# Patient Record
Sex: Male | Born: 1995 | Race: White | Hispanic: No | Marital: Single | Smoking: Never smoker
Health system: Southern US, Community
[De-identification: ages and names within clinical notes are randomized; demographics above are authoritative.]

## PROBLEM LIST (undated history)

## (undated) DIAGNOSIS — K219 Gastro-esophageal reflux disease without esophagitis: Secondary | ICD-10-CM

## (undated) DIAGNOSIS — IMO0001 Reserved for inherently not codable concepts without codable children: Secondary | ICD-10-CM

---
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
## 2013-05-17 ENCOUNTER — Emergency Department (HOSPITAL_COMMUNITY)
Admission: EM | Admit: 2013-05-17 | Discharge: 2013-05-17 | Discharge status: Against Medical Advice | Payer: Self-pay | Attending: Emergency Medicine | Admitting: Emergency Medicine

## 2013-05-17 DIAGNOSIS — F10929 Alcohol use, unspecified with intoxication, unspecified: Secondary | ICD-10-CM

## 2013-05-17 DIAGNOSIS — G4730 Sleep apnea, unspecified: Secondary | ICD-10-CM | POA: Insufficient documentation

## 2013-05-17 DIAGNOSIS — F1010 Alcohol abuse, uncomplicated: Secondary | ICD-10-CM | POA: Insufficient documentation

## 2013-05-17 NOTE — ED Notes (Signed)
Bed: WA04 Expected date:  Expected time:  Means of arrival:  Comments: 

## 2013-05-17 NOTE — ED Provider Notes (Signed)
CSN: [id number]     Arrival date & time 05/17/13  0358 History   First MD Initiated Contact with Patient 05/17/13 [phone]     Chief Complaint  Patient presents with  . Alcohol Intoxication     (Consider location/radiation/quality/duration/timing/severity/associated sxs/prior Treatment) Patient is a 18 y.o. male presenting with intoxication. The history is provided by the patient. The history is limited by the condition of the patient (Altered mental status).  Alcohol Intoxication  He was brought to the ED with report of possible intoxication. He states that he has not remember anything that happened at night but denies drinking. He states that he just wants to leave the ED. He states that he does not trust hospitals and does not trust doctors and states that I might be trying to strangle him.  No past medical history on file. No past surgical history on file. No family history on file. History  Substance Use Topics  . Smoking status: Not on file  . Smokeless tobacco: Not on file  . Alcohol Use: Not on file    Review of Systems  Unable to perform ROS: Mental status change      Allergies  Review of patient's allergies indicates not on file.  Home Medications  No current outpatient prescriptions on file. BP 107/66  Pulse 60  Resp 19  SpO2 99% Physical Exam  Nursing note and vitals reviewed.  18 year old male, resting comfortably and in no acute distress. Vital signs are normal. Oxygen saturation is 99%, which is normal. Head is normocephalic and atraumatic. PERRLA, EOMI. Oropharynx is clear. Neck is nontender and supple without adenopathy or JVD. Back is nontender and there is no CVA tenderness. Lungs are clear without rales, wheezes, or rhonchi. Chest is nontender. Heart has regular rate and rhythm without murmur. Abdomen is soft, flat, nontender without masses or hepatosplenomegaly and peristalsis is normoactive. Extremities have no cyanosis or edema, full range of  motion is present. Skin is warm and dry without rash. Neurologic: He is awake but slow to answer questions. Speech is slightly slurred. He is oriented to person and place but is slightly disoriented to time) thinks it is 05/23/2013), cranial nerves are intact, there are no motor or sensory deficits.  ED Course  Procedures (including critical care time) Labs Review Results for orders placed during the hospital encounter of 05/17/13  CBC WITH DIFFERENTIAL      Result Value Ref Range   WBC 8.1  4.0 - 10.5 K/uL   RBC 4.72  4.22 - 5.81 MIL/uL   Hemoglobin 15.0  13.0 - 17.0 g/dL   HCT 11.943.9  14.739.0 - 82.952.0 %   MCV 93.0  78.0 - 100.0 fL   MCH 31.8  26.0 - 34.0 pg   MCHC 34.2  30.0 - 36.0 g/dL   RDW 56.212.1  13.011.5 - 86.515.5 %   Platelets 246  150 - 400 K/uL   Neutrophils Relative % 56  43 - 77 %   Neutro Abs 4.5  1.7 - 7.7 K/uL   Lymphocytes Relative 35  12 - 46 %   Lymphs Abs 2.8  0.7 - 4.0 K/uL   Monocytes Relative 6  3 - 12 %   Monocytes Absolute 0.5  0.1 - 1.0 K/uL   Eosinophils Relative 3  0 - 5 %   Eosinophils Absolute 0.2  0.0 - 0.7 K/uL   Basophils Relative 0  0 - 1 %   Basophils Absolute 0.0  0.0 - 0.1 K/uL  COMPREHENSIVE METABOLIC PANEL      Result Value Ref Range   Sodium 144  137 - 147 mEq/L   Potassium 4.0  3.7 - 5.3 mEq/L   Chloride 103  96 - 112 mEq/L   CO2 27  19 - 32 mEq/L   Glucose, Bld 90  70 - 99 mg/dL   BUN 10  6 - 23 mg/dL   Creatinine, Ser 1.611.17  0.50 - 1.35 mg/dL   Calcium 8.8  8.4 - 09.610.5 mg/dL   Total Protein 6.8  6.0 - 8.3 g/dL   Albumin 3.8  3.5 - 5.2 g/dL   AST 17  0 - 37 U/L   ALT 12  0 - 53 U/L   Alkaline Phosphatase 67  39 - 117 U/L   Total Bilirubin 0.3  0.3 - 1.2 mg/dL   GFR calc non Af Amer 89 (*) >90 mL/min   GFR calc Af Amer >90  >90 mL/min  ETHANOL      Result Value Ref Range   Alcohol, Ethyl (B) 244 (*) 0 - 11 mg/dL  URINE RAPID DRUG SCREEN (HOSP PERFORMED)      Result Value Ref Range   Opiates NONE DETECTED  NONE DETECTED   Cocaine NONE DETECTED   NONE DETECTED   Benzodiazepines NONE DETECTED  NONE DETECTED   Amphetamines NONE DETECTED  NONE DETECTED   Tetrahydrocannabinol POSITIVE (*) NONE DETECTED   Barbiturates NONE DETECTED  NONE DETECTED   MDM   Final diagnoses:  Alcohol intoxication    Altered mental status which is likely due 2 intoxication with alcohol or other drugs. Alcohol level and are extremely been obtained.  Workup is significant for elevated alcohol level of 244 which would explain his altered mentation. Drug screen is positive for tetrahydrocannabinol, but this is not clinically relevant. Patient is reevaluated and is still not thinking clearly enough to be allowed to be discharged without observation. He is advised that if someone can come and get him, he can be discharged. Otherwise, he will need to be observed in the ED until he is able to manage safely for himself.  Dione Boozeavid Alfrieda Tarry, MD 05/17/13 [phone]  Dione Boozeavid Jacobie Stamey, MD 05/17/13 [phone]

## 2013-05-17 NOTE — Discharge Instructions (Signed)
Alcohol Intoxication °Alcohol intoxication occurs when the amount of alcohol that a person has consumed impairs his or her ability to mentally and physically function. Alcohol directly impairs the normal chemical activity of the brain. Drinking large amounts of alcohol can lead to changes in mental function and behavior, and it can cause many physical effects that can be harmful.  °Alcohol intoxication can range in severity from mild to very severe. Various factors can affect the level of intoxication that occurs, such as the person's age, gender, weight, frequency of alcohol consumption, and the presence of other medical conditions (such as diabetes, seizures, or heart conditions). Dangerous levels of alcohol intoxication may occur when people drink large amounts of alcohol in a short period (binge drinking). Alcohol can also be especially dangerous when combined with certain prescription medicines or "recreational" drugs. °SIGNS AND SYMPTOMS °Some common signs and symptoms of mild alcohol intoxication include: °· Loss of coordination. °· Changes in mood and behavior. °· Impaired judgment. °· Slurred speech. °As alcohol intoxication progresses to more severe levels, other signs and symptoms will appear. These may include: °· Vomiting. °· Confusion and impaired memory. °· Slowed breathing. °· Seizures. °· Loss of consciousness. °DIAGNOSIS  °Your health care provider will take a medical history and perform a physical exam. You will be asked about the amount and type of alcohol you have consumed. Blood tests will be done to measure the concentration of alcohol in your blood. In many places, your blood alcohol level must be lower than 80 mg/dL (0.08%) to legally drive. However, many dangerous effects of alcohol can occur at much lower levels.  °TREATMENT  °People with alcohol intoxication often do not require treatment. Most of the effects of alcohol intoxication are temporary, and they go away as the alcohol naturally  leaves the body. Your health care provider will monitor your condition until you are stable enough to go home. Fluids are sometimes given through an IV access tube to help prevent dehydration.  °HOME CARE INSTRUCTIONS °· Do not drive after drinking alcohol. °· Stay hydrated. Drink enough water and fluids to keep your urine clear or pale yellow. Avoid caffeine.   °· Only take over-the-counter or prescription medicines as directed by your health care provider.   °SEEK MEDICAL CARE IF:  °· You have persistent vomiting.   °· You do not feel better after a few days. °· You have frequent alcohol intoxication. Your health care provider can help determine if you should see a substance use treatment counselor. °SEEK IMMEDIATE MEDICAL CARE IF:  °· You become shaky or tremble when you try to stop drinking.   °· You shake uncontrollably (seizure).   °· You throw up (vomit) blood. This may be bright red or may look like black coffee grounds.   °· You have blood in your stool. This may be bright red or may appear as a black, tarry, bad smelling stool.   °· You become lightheaded or faint.   °MAKE SURE YOU:  °· Understand these instructions. °· Will watch your condition. °· Will get help right away if you are not doing well or get worse. °Document Released: 11/23/2004 Document Revised: 10/16/2012 Document Reviewed: 07/19/2012 °[employer]® Patient Information ©2014 [employer]. ° °

## 2014-02-02 ENCOUNTER — Encounter (HOSPITAL_BASED_OUTPATIENT_CLINIC_OR_DEPARTMENT_OTHER): Payer: Self-pay | Admitting: Emergency Medicine

## 2014-02-02 DIAGNOSIS — R630 Anorexia: Secondary | ICD-10-CM | POA: Insufficient documentation

## 2014-02-02 DIAGNOSIS — R1084 Generalized abdominal pain: Secondary | ICD-10-CM | POA: Insufficient documentation

## 2014-02-02 DIAGNOSIS — K5900 Constipation, unspecified: Secondary | ICD-10-CM | POA: Diagnosis not present

## 2014-02-02 NOTE — ED Notes (Signed)
Pt states that he has had abd pain since yesterday, denies any n/v

## 2014-02-03 ENCOUNTER — Emergency Department (HOSPITAL_BASED_OUTPATIENT_CLINIC_OR_DEPARTMENT_OTHER): Payer: Medicaid Other

## 2014-02-03 ENCOUNTER — Emergency Department (HOSPITAL_BASED_OUTPATIENT_CLINIC_OR_DEPARTMENT_OTHER)
Admission: EM | Admit: 2014-02-03 | Discharge: 2014-02-03 | Disposition: A | Discharge status: Home / Self Care | Payer: Medicaid Other | Attending: Emergency Medicine | Admitting: Emergency Medicine

## 2014-02-03 ENCOUNTER — Encounter (HOSPITAL_BASED_OUTPATIENT_CLINIC_OR_DEPARTMENT_OTHER): Payer: Self-pay | Admitting: Emergency Medicine

## 2014-02-03 DIAGNOSIS — R109 Unspecified abdominal pain: Secondary | ICD-10-CM

## 2014-02-03 DIAGNOSIS — R52 Pain, unspecified: Secondary | ICD-10-CM

## 2014-02-03 LAB — CBC WITH DIFFERENTIAL/PLATELET
BASOS ABS: 0 10*3/uL (ref 0.0–0.1)
Basophils Relative: 0 % (ref 0–1)
EOS PCT: 0 % (ref 0–5)
Eosinophils Absolute: 0.1 10*3/uL (ref 0.0–0.7)
HCT: 42.8 % (ref 39.0–52.0)
Hemoglobin: 14.7 g/dL (ref 13.0–17.0)
Lymphocytes Relative: 28 % (ref 12–46)
Lymphs Abs: 4.9 10*3/uL — ABNORMAL HIGH (ref 0.7–4.0)
MCH: 29.4 pg (ref 26.0–34.0)
MCHC: 34.3 g/dL (ref 30.0–36.0)
MCV: 85.6 fL (ref 78.0–100.0)
Monocytes Absolute: 1.7 10*3/uL — ABNORMAL HIGH (ref 0.1–1.0)
Monocytes Relative: 10 % (ref 3–12)
Neutro Abs: 10.8 10*3/uL — ABNORMAL HIGH (ref 1.7–7.7)
Neutrophils Relative %: 62 % (ref 43–77)
PLATELETS: 322 10*3/uL (ref 150–400)
RBC: 5 MIL/uL (ref 4.22–5.81)
RDW: 13.1 % (ref 11.5–15.5)
WBC: 17.6 10*3/uL — ABNORMAL HIGH (ref 4.0–10.5)

## 2014-02-03 LAB — COMPREHENSIVE METABOLIC PANEL
ALT: 20 U/L (ref 0–53)
AST: 18 U/L (ref 0–37)
Albumin: 5.1 g/dL (ref 3.5–5.2)
Alkaline Phosphatase: 70 U/L (ref 39–117)
Anion gap: 19 — ABNORMAL HIGH (ref 5–15)
BUN: 11 mg/dL (ref 6–23)
CALCIUM: 10.3 mg/dL (ref 8.4–10.5)
CO2: 24 mEq/L (ref 19–32)
CREATININE: 0.9 mg/dL (ref 0.50–1.35)
Chloride: 101 mEq/L (ref 96–112)
GLUCOSE: 93 mg/dL (ref 70–99)
Potassium: 3.7 mEq/L (ref 3.7–5.3)
SODIUM: 144 meq/L (ref 137–147)
Total Bilirubin: 0.5 mg/dL (ref 0.3–1.2)
Total Protein: 8.6 g/dL — ABNORMAL HIGH (ref 6.0–8.3)

## 2014-02-03 LAB — URINALYSIS, ROUTINE W REFLEX MICROSCOPIC
Bilirubin Urine: NEGATIVE
Glucose, UA: NEGATIVE mg/dL
HGB URINE DIPSTICK: NEGATIVE
KETONES UR: NEGATIVE mg/dL
Leukocytes, UA: NEGATIVE
Nitrite: NEGATIVE
PROTEIN: NEGATIVE mg/dL
Specific Gravity, Urine: 1.011 (ref 1.005–1.030)
UROBILINOGEN UA: 0.2 mg/dL (ref 0.0–1.0)
pH: 7.5 (ref 5.0–8.0)

## 2014-02-03 MED ORDER — GI COCKTAIL ~~LOC~~
30.0000 mL | Freq: Once | ORAL | Status: AC
  Administered 2014-02-03: 30 mL via ORAL
  Filled 2014-02-03: qty 30

## 2014-02-03 MED ORDER — OMEPRAZOLE 20 MG PO CPDR: 20.0000 mg | DELAYED_RELEASE_CAPSULE | Freq: Every day | ORAL | Status: AC

## 2014-02-03 MED ORDER — IOHEXOL 300 MG/ML  SOLN
25.0000 mL | Freq: Once | INTRAMUSCULAR | Status: AC | PRN
  Administered 2014-02-03: 25 mL via ORAL

## 2014-02-03 MED ORDER — IOHEXOL 300 MG/ML  SOLN
100.0000 mL | Freq: Once | INTRAMUSCULAR | Status: AC | PRN
  Administered 2014-02-03: 100 mL via INTRAVENOUS

## 2014-02-03 MED ORDER — DICYCLOMINE HCL 10 MG/ML IM SOLN
20.0000 mg | Freq: Once | INTRAMUSCULAR | Status: AC
  Administered 2014-02-03: 20 mg via INTRAMUSCULAR
  Filled 2014-02-03: qty 2

## 2014-02-03 MED ORDER — SODIUM CHLORIDE 0.9 % IV BOLUS (SEPSIS)
1000.0000 mL | Freq: Once | INTRAVENOUS | Status: AC
  Administered 2014-02-03: 1000 mL via INTRAVENOUS

## 2014-02-03 NOTE — ED Provider Notes (Signed)
CSN: [id number]     Arrival date & time 02/02/14  2136 History  This chart was scribed for Clydene Burack Smitty CordsK Lavetta Geier-Rasch, MD by Richarda Overlieichard Holland, ED Scribe. This patient was seen in room MH04/MH04 and the patient's care was started 12:06 AM.    Chief Complaint  Patient presents with  . Abdominal Pain   Patient is a 18 y.o. male presenting with abdominal pain. The history is provided by the patient.  Abdominal Pain Pain location:  Generalized Pain quality: cramping   Pain radiates to:  Does not radiate Pain severity:  Moderate Onset quality:  Gradual Timing:  Constant Progression:  Unchanged Chronicity:  New Context: not alcohol use, not suspicious food intake and not trauma   Relieved by:  Nothing Worsened by:  Nothing tried Ineffective treatments:  None tried Associated symptoms: constipation   Associated symptoms: no dysuria, no nausea and no vomiting   Risk factors: has not had multiple surgeries    HPI Comments: Joseph Dean is a 18 y.o. male who presents to the Emergency Department complaining of abdominal pain that started yesterday. He states it feels swollen in his RUQ currently. He reports he has not had much of an appetite since the onset of his pain. He states he has been constipated and has taken miralax which has failed to relieve his constipation. He states he has had a few small BMs today. Pt reports his last normal BM was yesterday or the day before. Pt reports that he does not think food affects the pain. He denies dysuria. He reports no pertinent past medical history. Pt reports no alleviating factors at this time.   History reviewed. No pertinent past medical history. History reviewed. No pertinent past surgical history. History reviewed. No pertinent family history. History  Substance Use Topics  . Smoking status: Never Smoker   . Smokeless tobacco: Not on file  . Alcohol Use: No    Review of Systems  Constitutional: Positive for appetite change.   Gastrointestinal: Positive for abdominal pain and constipation. Negative for nausea and vomiting.  Genitourinary: Positive for frequency. Negative for dysuria.  All other systems reviewed and are negative.   Allergies  Review of patient's allergies indicates no known allergies.  Home Medications   Prior to Admission medications   Not on File   BP 169/79 mmHg  Pulse 105  Temp(Src) 99.4 F (37.4 C) (Oral)  Resp 18  Ht 5\' 7"  (1.702 m)  Wt 230 lb (104.327 kg)  BMI 36.01 kg/m2  SpO2 100% Physical Exam  Constitutional: He is oriented to person, place, and time. He appears well-developed and well-nourished. No distress.  HENT:  Head: Normocephalic and atraumatic.  Mouth/Throat: Oropharynx is clear and moist. No oropharyngeal exudate.  Eyes: Conjunctivae and EOM are normal. Pupils are equal, round, and reactive to light.  Neck: Normal range of motion. Neck supple.  No meningismus.  Cardiovascular: Normal rate, regular rhythm, normal heart sounds and intact distal pulses.   No murmur heard. Pulmonary/Chest: Effort normal and breath sounds normal. No respiratory distress. He has no wheezes. He has no rales.  Abdominal: Soft. He exhibits no distension and no mass. There is no tenderness. There is no rebound and no guarding.  Hyperactive bowel sounds.   Musculoskeletal: Normal range of motion. He exhibits no edema or tenderness.  Neurological: He is alert and oriented to person, place, and time. No cranial nerve deficit. He exhibits normal muscle tone. Coordination normal.  Skin: Skin is warm and dry.  Psychiatric: He  has a normal mood and affect. His behavior is normal.  Nursing note and vitals reviewed.   ED Course  Procedures  DIAGNOSTIC STUDIES: Oxygen Saturation is 100% on RA, normal by my interpretation.    COORDINATION OF CARE: 12:13 AM Discussed treatment plan with pt at bedside and pt agreed to plan.   Labs Review Labs Reviewed  CBC WITH DIFFERENTIAL   COMPREHENSIVE METABOLIC PANEL  LIPASE, BLOOD    Imaging Review No results found.   EKG Interpretation None      MDM   Final diagnoses:  None  Well appearing with benign exam.  Has had constipation.  Suspect the white count is viral.  CT is normal.  Will give rx for omeprazole and bland diet instructions.  Follow up with your PMD for ongoing care.     I personally performed the services described in this documentation, which was scribed in my presence. The recorded information has been reviewed and is accurate.       Jasmine AweApril K Liseth Wann-Rasch, MD 02/03/14 [phone]

## 2014-02-05 ENCOUNTER — Encounter (HOSPITAL_BASED_OUTPATIENT_CLINIC_OR_DEPARTMENT_OTHER): Payer: Self-pay | Admitting: *Deleted

## 2014-02-05 ENCOUNTER — Emergency Department (HOSPITAL_BASED_OUTPATIENT_CLINIC_OR_DEPARTMENT_OTHER)
Admission: EM | Admit: 2014-02-05 | Discharge: 2014-02-05 | Disposition: A | Discharge status: Home / Self Care | Payer: Medicaid Other | Attending: Emergency Medicine | Admitting: Emergency Medicine

## 2014-02-05 DIAGNOSIS — Z79899 Other long term (current) drug therapy: Secondary | ICD-10-CM | POA: Insufficient documentation

## 2014-02-05 DIAGNOSIS — R110 Nausea: Secondary | ICD-10-CM | POA: Insufficient documentation

## 2014-02-05 DIAGNOSIS — R1012 Left upper quadrant pain: Secondary | ICD-10-CM | POA: Insufficient documentation

## 2014-02-05 DIAGNOSIS — R109 Unspecified abdominal pain: Secondary | ICD-10-CM

## 2014-02-05 LAB — URINALYSIS, ROUTINE W REFLEX MICROSCOPIC
BILIRUBIN URINE: NEGATIVE
Glucose, UA: NEGATIVE mg/dL
HGB URINE DIPSTICK: NEGATIVE
Ketones, ur: NEGATIVE mg/dL
Leukocytes, UA: NEGATIVE
Nitrite: NEGATIVE
PROTEIN: NEGATIVE mg/dL
Specific Gravity, Urine: 1.004 — ABNORMAL LOW (ref 1.005–1.030)
UROBILINOGEN UA: 0.2 mg/dL (ref 0.0–1.0)
pH: 6.5 (ref 5.0–8.0)

## 2014-02-05 LAB — CBC WITH DIFFERENTIAL/PLATELET
Basophils Absolute: 0 10*3/uL (ref 0.0–0.1)
Basophils Relative: 0 % (ref 0–1)
Eosinophils Absolute: 0 10*3/uL (ref 0.0–0.7)
Eosinophils Relative: 0 % (ref 0–5)
HCT: 41.7 % (ref 39.0–52.0)
HEMOGLOBIN: 14.8 g/dL (ref 13.0–17.0)
LYMPHS ABS: 1.5 10*3/uL (ref 0.7–4.0)
LYMPHS PCT: 14 % (ref 12–46)
MCH: 29.8 pg (ref 26.0–34.0)
MCHC: 35.5 g/dL (ref 30.0–36.0)
MCV: 84.1 fL (ref 78.0–100.0)
MONOS PCT: 8 % (ref 3–12)
Monocytes Absolute: 0.9 10*3/uL (ref 0.1–1.0)
NEUTROS PCT: 78 % — AB (ref 43–77)
Neutro Abs: 8.4 10*3/uL — ABNORMAL HIGH (ref 1.7–7.7)
PLATELETS: 307 10*3/uL (ref 150–400)
RBC: 4.96 MIL/uL (ref 4.22–5.81)
RDW: 12.9 % (ref 11.5–15.5)
WBC: 10.8 10*3/uL — AB (ref 4.0–10.5)

## 2014-02-05 LAB — COMPREHENSIVE METABOLIC PANEL
ALK PHOS: 71 U/L (ref 39–117)
ALT: 19 U/L (ref 0–53)
ANION GAP: 16 — AB (ref 5–15)
AST: 18 U/L (ref 0–37)
Albumin: 5 g/dL (ref 3.5–5.2)
BILIRUBIN TOTAL: 0.3 mg/dL (ref 0.3–1.2)
BUN: 11 mg/dL (ref 6–23)
CHLORIDE: 103 meq/L (ref 96–112)
CO2: 23 meq/L (ref 19–32)
Calcium: 10.2 mg/dL (ref 8.4–10.5)
Creatinine, Ser: 0.9 mg/dL (ref 0.50–1.35)
GLUCOSE: 104 mg/dL — AB (ref 70–99)
POTASSIUM: 4.4 meq/L (ref 3.7–5.3)
SODIUM: 142 meq/L (ref 137–147)
Total Protein: 8.5 g/dL — ABNORMAL HIGH (ref 6.0–8.3)

## 2014-02-05 LAB — LIPASE, BLOOD: Lipase: 12 U/L (ref 11–59)

## 2014-02-05 MED ORDER — GI COCKTAIL ~~LOC~~
30.0000 mL | Freq: Once | ORAL | Status: AC
  Administered 2014-02-05: 30 mL via ORAL
  Filled 2014-02-05: qty 30

## 2014-02-05 MED ORDER — DICYCLOMINE HCL 20 MG PO TABS: 20.0000 mg | ORAL_TABLET | Freq: Three times a day (TID) | ORAL | Status: AC | PRN

## 2014-02-05 MED ORDER — SODIUM CHLORIDE 0.9 % IV BOLUS (SEPSIS)
1000.0000 mL | Freq: Once | INTRAVENOUS | Status: AC
  Administered 2014-02-05: 1000 mL via INTRAVENOUS

## 2014-02-05 MED ORDER — DICYCLOMINE HCL 10 MG/ML IM SOLN
20.0000 mg | Freq: Once | INTRAMUSCULAR | Status: AC
  Administered 2014-02-05: 20 mg via INTRAMUSCULAR
  Filled 2014-02-05: qty 2

## 2014-02-05 MED ORDER — DICYCLOMINE HCL 10 MG PO CAPS: 10.0000 mg | ORAL_CAPSULE | Freq: Once | ORAL | Status: DC

## 2014-02-05 NOTE — ED Provider Notes (Signed)
CSN: [id number]     Arrival date & time 02/05/14  1249 History   First MD Initiated Contact with Patient 02/05/14 1325     Chief Complaint  Patient presents with  . Abdominal Pain     (Consider location/radiation/quality/duration/timing/severity/associated sxs/prior Treatment) The history is provided by the patient.  Joseph Dean is a 18 y.o. male here presenting with abdominal pain. Been having left upper quadrant abdominal pain for the last 3 days. Came in 2 days ago and was seen in the ER and had WBC 17, normal UA and normal CT abdomen pelvis. He was thought to have reflux and was started on Prilosec. He has been having intermittent constipation but was able to have small bowel movement today. Denies any nausea vomiting. Describes the pain as cramping in the left upper quadrant with no radiation. Denies any fevers or chills. He tries to take some linzess samples that his sister gave him with no relief. No hx of IBS or IBD. No hx of gallstones.    History reviewed. No pertinent past medical history. History reviewed. No pertinent past surgical history. No family history on file. History  Substance Use Topics  . Smoking status: Never Smoker   . Smokeless tobacco: Never Used  . Alcohol Use: No    Review of Systems  Gastrointestinal: Positive for nausea and abdominal pain.  All other systems reviewed and are negative.     Allergies  Review of patient's allergies indicates no known allergies.  Home Medications   Prior to Admission medications   Medication Sig Start Date End Date Taking? Authorizing Provider  dicyclomine (BENTYL) 20 MG tablet Take 1 tablet (20 mg total) by mouth 3 (three) times daily with meals as needed for spasms. 02/05/14   Richardean Canalavid H Yao, MD  omeprazole (PRILOSEC) 20 MG capsule Take 1 capsule (20 mg total) by mouth daily. 02/03/14   April K Palumbo-Rasch, MD   BP 124/72 mmHg  Pulse 76  Temp(Src) 98.7 F (37.1 C) (Oral)  Resp 18  Ht 5\' 7"  (1.702 m)  Wt  219 lb 5 oz (99.479 kg)  BMI 34.34 kg/m2  SpO2 99% Physical Exam  Constitutional: He is oriented to person, place, and time. He appears well-developed and well-nourished.  HENT:  Head: Normocephalic.  Mouth/Throat: Oropharynx is clear and moist.  Eyes: Conjunctivae are normal. Pupils are equal, round, and reactive to light.  Neck: Normal range of motion. Neck supple.  Cardiovascular: Normal rate, regular rhythm and normal heart sounds.   Pulmonary/Chest: Effort normal and breath sounds normal. No respiratory distress. He has no wheezes. He has no rales.  Abdominal: Soft. Bowel sounds are normal. He exhibits no distension. There is no tenderness. There is no rebound and no guarding.  Musculoskeletal: Normal range of motion. He exhibits no edema or tenderness.  Neurological: He is alert and oriented to person, place, and time. No cranial nerve deficit. Coordination normal.  Skin: Skin is warm and dry.  Psychiatric: He has a normal mood and affect. His behavior is normal. Thought content normal.  Nursing note and vitals reviewed.   ED Course  Procedures (including critical care time) Labs Review Labs Reviewed  CBC WITH DIFFERENTIAL - Abnormal; Notable for the following:    WBC 10.8 (*)    Neutrophils Relative % 78 (*)    Neutro Abs 8.4 (*)    All other components within normal limits  COMPREHENSIVE METABOLIC PANEL - Abnormal; Notable for the following:    Glucose, Bld 104 (*)  Total Protein 8.5 (*)    Anion gap 16 (*)    All other components within normal limits  URINALYSIS, ROUTINE W REFLEX MICROSCOPIC - Abnormal; Notable for the following:    Specific Gravity, Urine 1.004 (*)    All other components within normal limits  LIPASE, BLOOD    Imaging Review No results found.   EKG Interpretation None      MDM   Final diagnoses:  Abdominal cramps    Joseph Dean is a 18 y.o. male here with ab pain, cramps. Abdomen nontender on my exam. WBC down to 11 from 17.  Repeat CMP and UA unremarkable. I think likely cramps vs IBS vs IBD. Given nl CT 2 days ago, I don't think any imaging in the ED will help. I don't think he has acute chole. I think he can f/u with GI to get colonoscopy and further workup for IBS vs IBD. Told him to continue prilosec. Will add bentyl as needed for cramps.     Richardean Canalavid H Yao, MD 02/05/14 [phone]

## 2014-02-05 NOTE — ED Notes (Signed)
Patient assisted to & from restroom.

## 2014-02-05 NOTE — ED Notes (Signed)
Seen here Tuesday for same abd pain- states not feeling better- needs referral for PCP

## 2014-02-05 NOTE — Discharge Instructions (Signed)
Continue taking prilosec.   Take bentyl as needed for cramps.   Follow up with your doctor.   You should see GI doctor.   Return to ER if you have worse cramps, abdominal pain, vomiting.

## 2015-01-12 ENCOUNTER — Encounter (HOSPITAL_BASED_OUTPATIENT_CLINIC_OR_DEPARTMENT_OTHER): Payer: Self-pay | Admitting: *Deleted

## 2015-01-12 ENCOUNTER — Emergency Department (HOSPITAL_BASED_OUTPATIENT_CLINIC_OR_DEPARTMENT_OTHER)
Admission: EM | Admit: 2015-01-12 | Discharge: 2015-01-12 | Disposition: A | Discharge status: Home / Self Care | Payer: Medicaid Other | Attending: Emergency Medicine | Admitting: Emergency Medicine

## 2015-01-12 DIAGNOSIS — M25511 Pain in right shoulder: Secondary | ICD-10-CM | POA: Insufficient documentation

## 2015-01-12 DIAGNOSIS — K219 Gastro-esophageal reflux disease without esophagitis: Secondary | ICD-10-CM | POA: Insufficient documentation

## 2015-01-12 DIAGNOSIS — M25512 Pain in left shoulder: Secondary | ICD-10-CM | POA: Insufficient documentation

## 2015-01-12 DIAGNOSIS — Z79899 Other long term (current) drug therapy: Secondary | ICD-10-CM | POA: Insufficient documentation

## 2015-01-12 DIAGNOSIS — Z0000 Encounter for general adult medical examination without abnormal findings: Secondary | ICD-10-CM

## 2015-01-12 DIAGNOSIS — M546 Pain in thoracic spine: Secondary | ICD-10-CM | POA: Insufficient documentation

## 2015-01-12 DIAGNOSIS — R590 Localized enlarged lymph nodes: Secondary | ICD-10-CM | POA: Insufficient documentation

## 2015-01-12 HISTORY — DX: Gastro-esophageal reflux disease without esophagitis: K21.9

## 2015-01-12 HISTORY — DX: Reserved for inherently not codable concepts without codable children: IMO0001

## 2015-01-12 NOTE — Discharge Instructions (Signed)
No concerning findings were noted during your examination today.  These areas you are feeling in your neck are small, normal size, normally positioned lymph nodes.  Primary care physician as needed for any additional health needs.

## 2015-01-12 NOTE — ED Notes (Signed)
Pt amb to room 3 with quick steady gait smiling in nad. Pt reports lateral neck pain with a "lump" on the side of his neck. Pt states he also feels like he has a "lump" on his head and he feels like a "lump" also between his shoulder blades. 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

## 2015-01-12 NOTE — ED Provider Notes (Signed)
CSN: [id number]     Arrival date & time 01/12/15  0848 History   First MD Initiated Contact with Patient 01/12/15 [phone]     Chief Complaint  Patient presents with  . Neck Pain      HPI  Patient resists evaluation of "lumps". He states that he feels "small bumps like BBs" his neck. Also states his symptoms or lays on his back he has a tender spot that feels like "another swollen lump. This second area is in his left back inferior to his scapula. He states he works at a [employer]. He does a lot of meal prep. He states that sometimes he will have to do things with his other hand because he will get some discomfort in his neck and back and shoulders.  Past Medical History  Diagnosis Date  . Reflux    History reviewed. No pertinent past surgical history. History reviewed. No pertinent family history. Social History  Substance Use Topics  . Smoking status: Never Smoker   . Smokeless tobacco: Never Used  . Alcohol Use: No    Review of Systems  Constitutional: Negative for fever, chills, diaphoresis, appetite change and fatigue.  HENT: Negative for mouth sores, sore throat and trouble swallowing.   Eyes: Negative for visual disturbance.  Respiratory: Negative for cough, chest tightness, shortness of breath and wheezing.   Cardiovascular: Negative for chest pain.  Gastrointestinal: Negative for nausea, vomiting, abdominal pain, diarrhea and abdominal distention.  Endocrine: Negative for polydipsia, polyphagia and polyuria.  Genitourinary: Negative for dysuria, frequency and hematuria.  Musculoskeletal: Negative for gait problem.       Sensation of "lumps"  Skin: Negative for color change, pallor and rash.  Neurological: Negative for dizziness, syncope, light-headedness and headaches.  Hematological: Does not bruise/bleed easily.  Psychiatric/Behavioral: Negative for behavioral problems and confusion.      Allergies  Review of patient's allergies indicates no known  allergies.  Home Medications   Prior to Admission medications   Medication Sig Start Date End Date Taking? Authorizing Provider  dicyclomine (BENTYL) 20 MG tablet Take 1 tablet (20 mg total) by mouth 3 (three) times daily with meals as needed for spasms. 02/05/14   Richardean Canalavid H Yao, MD  omeprazole (PRILOSEC) 20 MG capsule Take 1 capsule (20 mg total) by mouth daily. 02/03/14   April Palumbo, MD   BP 131/78 mmHg  Pulse 80  Temp(Src) 98 F (36.7 C) (Oral)  Resp 18  Ht 5\' 8"  (1.727 m)  Wt 214 lb (97.07 kg)  BMI 32.55 kg/m2  SpO2 100% Physical Exam  Constitutional: He is oriented to person, place, and time. He appears well-developed and well-nourished. No distress.  HENT:  Head: Normocephalic.  Eyes: Conjunctivae are normal. Pupils are equal, round, and reactive to light. No scleral icterus.  Neck: Normal range of motion. Neck supple. No thyromegaly present.  Normal sized uninflamed barely palpable lymphadenopathy in the bilateral posterior neck and suboccipital  Cardiovascular: Normal rate and regular rhythm.  Exam reveals no gallop and no friction rub.   No murmur heard. Pulmonary/Chest: Effort normal and breath sounds normal. No respiratory distress. He has no wheezes. He has no rales.  Abdominal: Soft. Bowel sounds are normal. He exhibits no distension. There is no tenderness. There is no rebound.  Musculoskeletal: Normal range of motion.       Back:  Neurological: He is alert and oriented to person, place, and time.  Skin: Skin is warm and dry. No rash noted.  Psychiatric:  He has a normal mood and affect. His behavior is normal.    ED Course  Procedures (including critical care time) Labs Review Labs Reviewed - No data to display  Imaging Review No results found. I have personally reviewed and evaluated these images and lab results as part of my medical decision-making.   EKG Interpretation None      MDM   Final diagnoses:  Normal physical exam    No concerning  findings on exam.    Rolland Porter, MD 01/12/15 [phone]

## 2016-04-01 IMAGING — CT CT ABD-PELV W/ CM
2 of 4 series · 17 of 46 positions shown, 19 images · IV contrast (APPLIED)
Comparison: None.

CLINICAL DATA: Right upper quadrant pain, nausea, and constipation
for 1 day.

EXAM:
CT ABDOMEN AND PELVIS WITH CONTRAST
TECHNIQUE: Multidetector CT imaging of the abdomen and pelvis was performed
using the standard protocol following bolus administration of
intravenous contrast.
CONTRAST:  25mL OMNIPAQUE IOHEXOL 300 MG/ML SOLN, 100mL OMNIPAQUE
IOHEXOL 300 MG/ML SOLN

[Series 2: abd/pelvis 5.0 b31f · axial · 0.81mm/px · z∈[+830,+1286]mm · 14 of 101 slices shown, 16 images]
[im 5/101  soft-tissue]
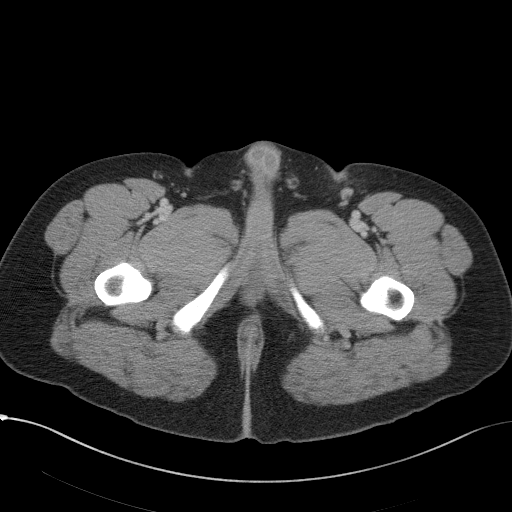
[im 5/101  bone]
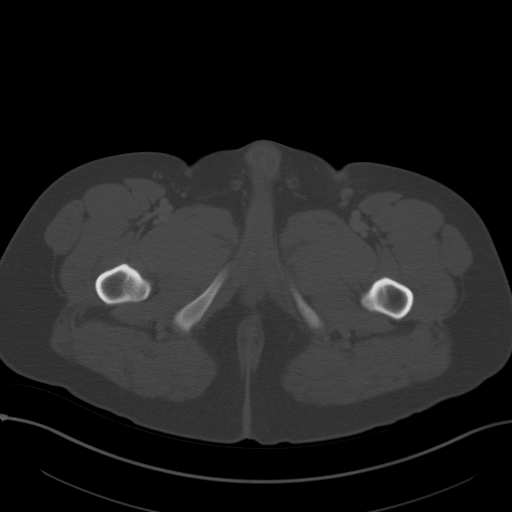
[im 14/101  soft-tissue]
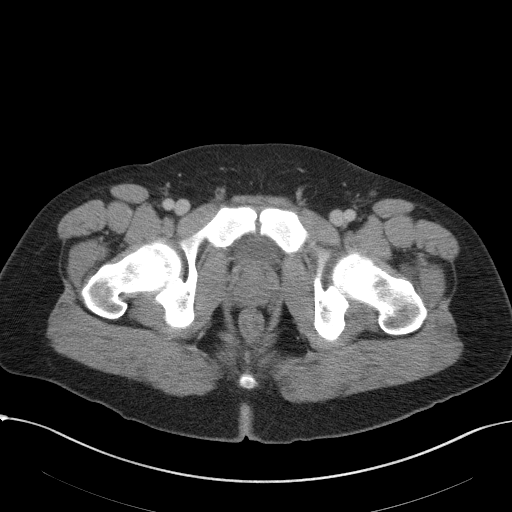
[im 19/101  soft-tissue]
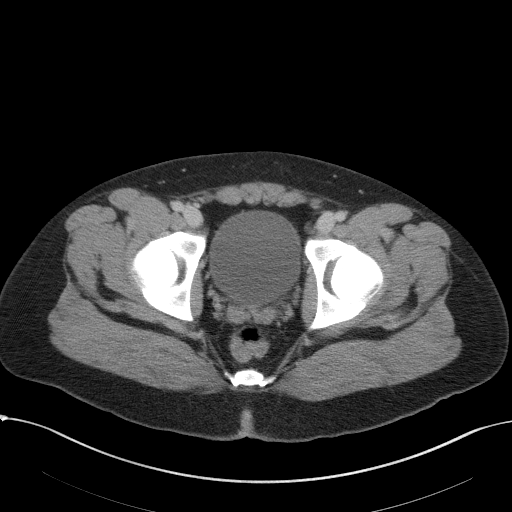
[im 28/101  soft-tissue]
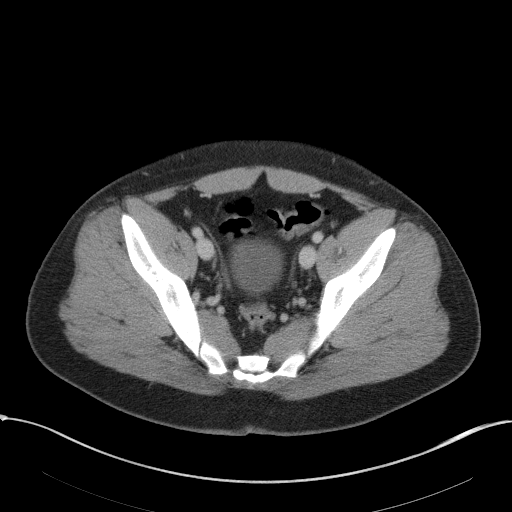
[im 32/101  soft-tissue]
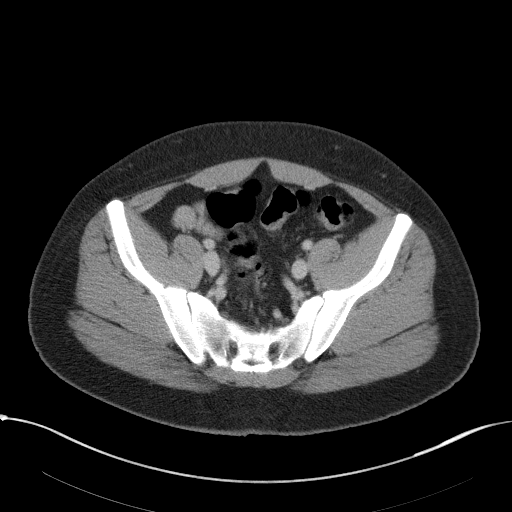
[im 41/101  soft-tissue]
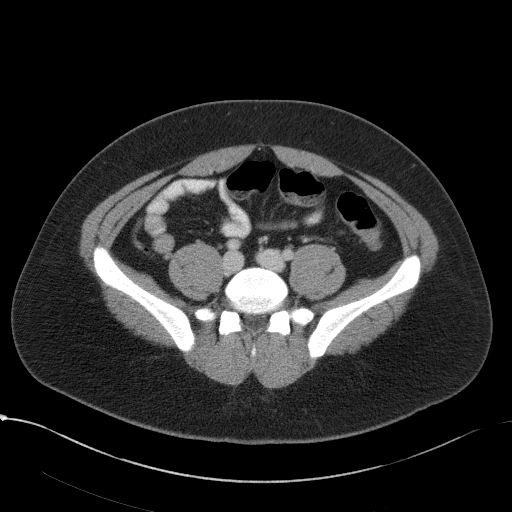
[im 46/101  soft-tissue]
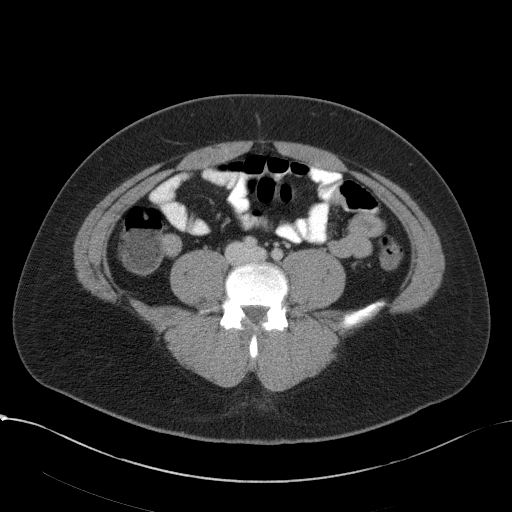
[im 55/101  soft-tissue]
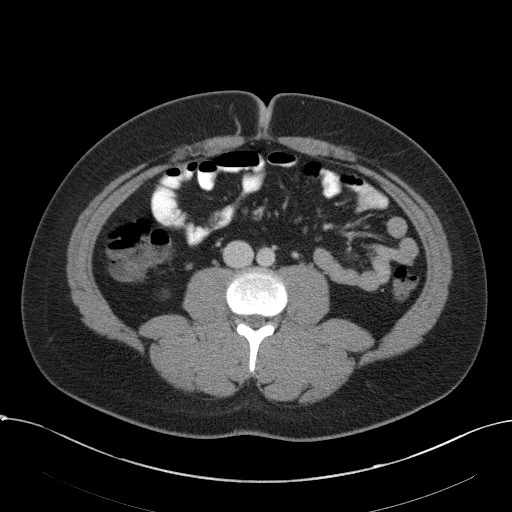
[im 60/101  soft-tissue]
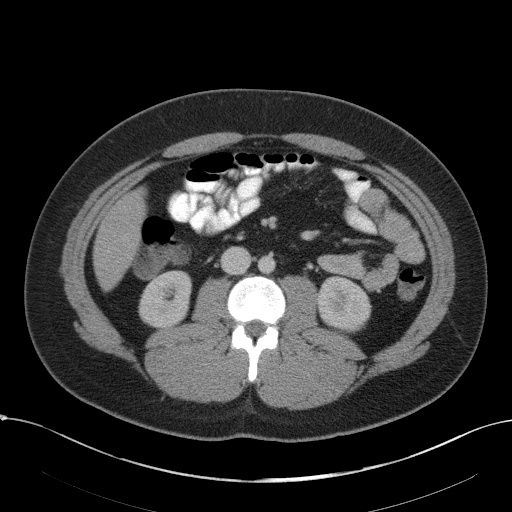
[im 60/101  bone]
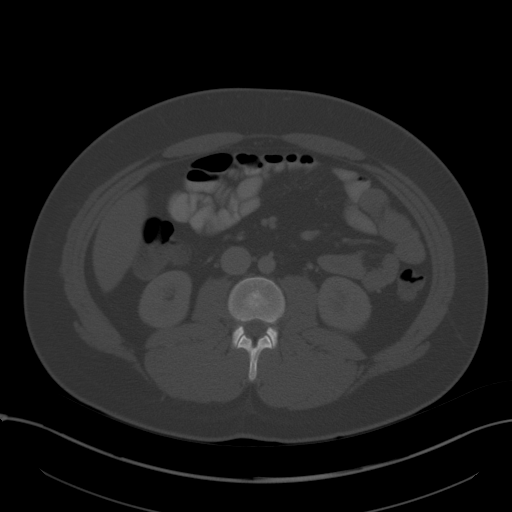
[im 69/101  soft-tissue]
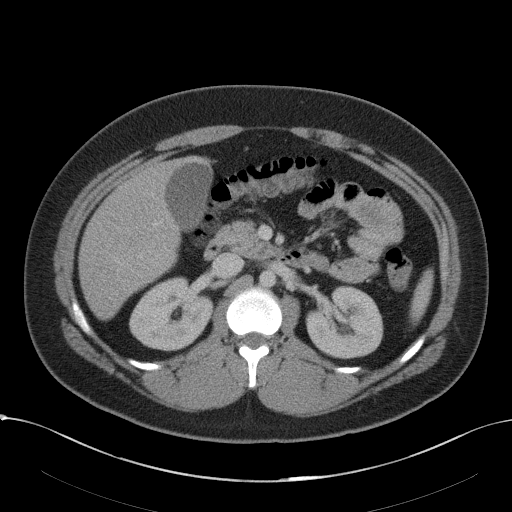
[im 73/101  soft-tissue]
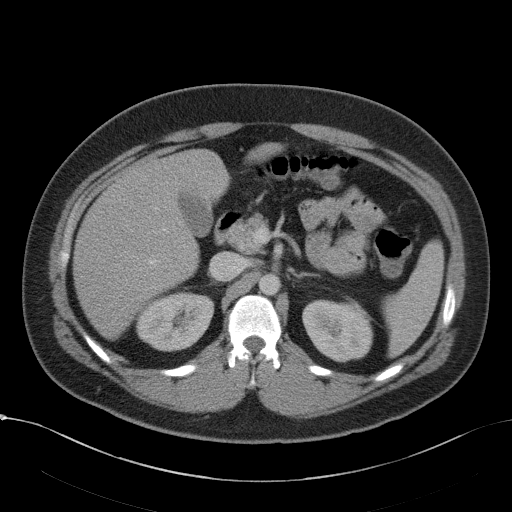
[im 82/101  soft-tissue]
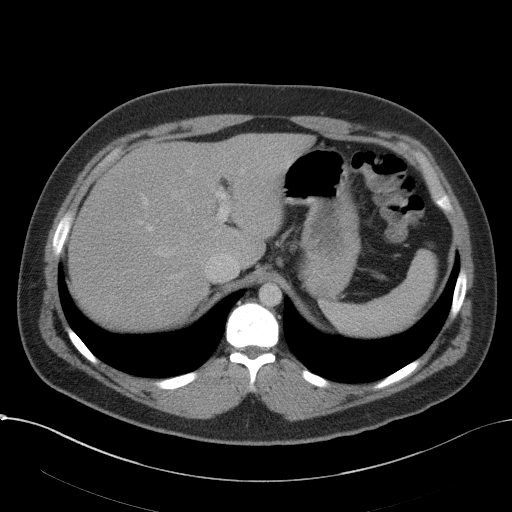
[im 87/101  soft-tissue]
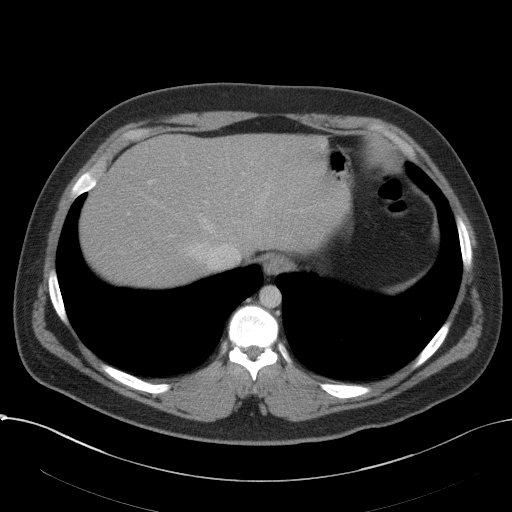
[im 96/101  soft-tissue]
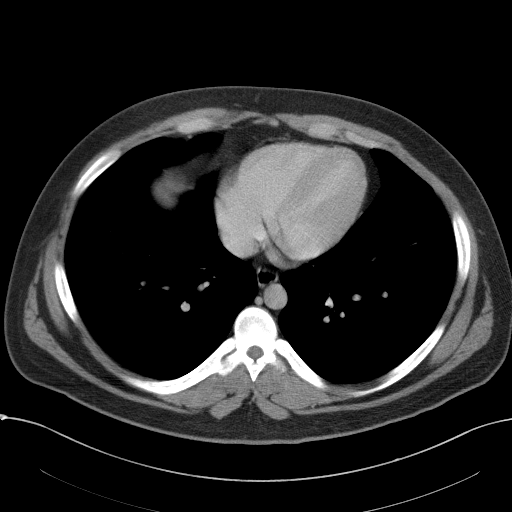

[Series 5: abd/pelvis 3.0 coronal · coronal · 0.88mm/px · 3 of 94 slices shown]
[im 32/94  soft-tissue]
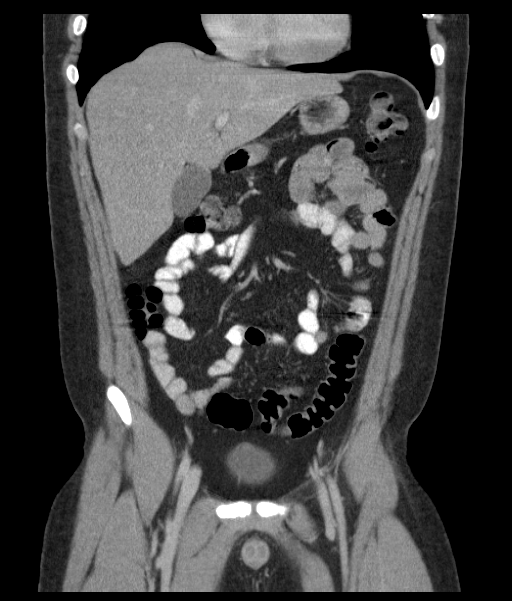
[im 42/94  soft-tissue]
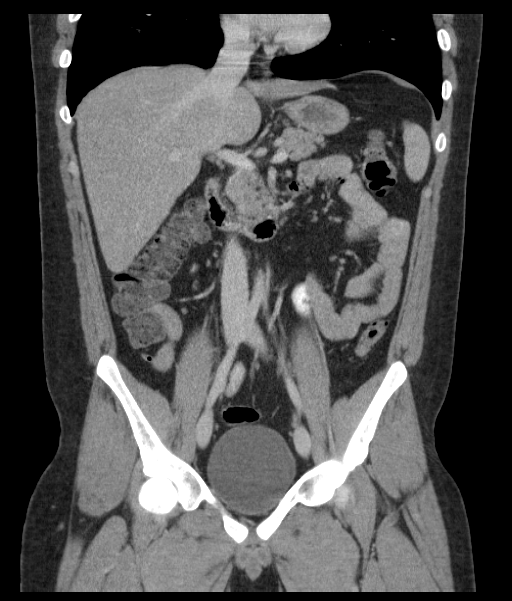
[im 52/94  soft-tissue]
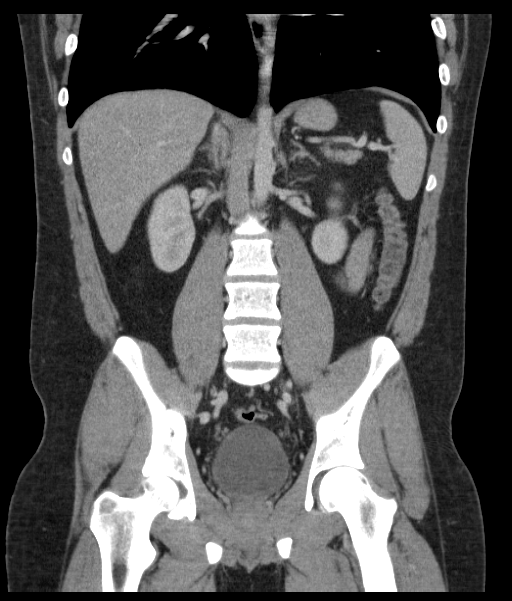

[17 of 46 positions shown; findings below may reference images not displayed]

FINDINGS: Lung bases are clear.

The liver, spleen, gallbladder, pancreas, adrenal glands, kidneys,
abdominal aorta, inferior vena cava, and retroperitoneal lymph nodes
are unremarkable. Small accessory spleen. Stomach, small bowel, and
colon appear normal. No free air or free fluid in the abdomen.

Pelvis: The appendix is normal. Bladder wall is not thickened.
Prostate gland is not enlarged. No free or loculated pelvic fluid
collections. No pelvic mass or lymphadenopathy. No destructive bone
lesions.
IMPRESSION: Normal examination. No focal acute process identified to explain
patient's pain.
